# Patient Record
Sex: Male | Born: 1960 | Race: White | Hispanic: No | Smoking: Current every day smoker
Health system: Southern US, Community
[De-identification: ages and names within clinical notes are randomized; demographics above are authoritative.]

## PROBLEM LIST (undated history)

## (undated) DIAGNOSIS — T7840XA Allergy, unspecified, initial encounter: Secondary | ICD-10-CM

## (undated) DIAGNOSIS — I10 Essential (primary) hypertension: Secondary | ICD-10-CM

## (undated) DIAGNOSIS — E785 Hyperlipidemia, unspecified: Secondary | ICD-10-CM

## (undated) DIAGNOSIS — B882 Other arthropod infestations: Secondary | ICD-10-CM

## (undated) DIAGNOSIS — M1990 Unspecified osteoarthritis, unspecified site: Secondary | ICD-10-CM

## (undated) HISTORY — DX: Essential (primary) hypertension: I10

## (undated) HISTORY — DX: Other arthropod infestations: B88.2

## (undated) HISTORY — DX: Allergy, unspecified, initial encounter: T78.40XA

## (undated) HISTORY — DX: Hyperlipidemia, unspecified: E78.5

## (undated) HISTORY — PX: DENTAL SURGERY: SHX609

## (undated) HISTORY — DX: Unspecified osteoarthritis, unspecified site: M19.90

---

## 1984-07-10 HISTORY — PX: COLONOSCOPY: SHX174

## 1984-07-10 HISTORY — PX: ANAL FISSURE REPAIR: SHX2312

## 1984-07-10 HISTORY — PX: POLYPECTOMY: SHX149

## 1999-03-16 ENCOUNTER — Emergency Department (HOSPITAL_COMMUNITY): Admission: EM | Admit: 1999-03-16 | Discharge: 1999-03-16 | Payer: Self-pay | Admitting: Emergency Medicine

## 1999-03-16 ENCOUNTER — Encounter: Payer: Self-pay | Admitting: Emergency Medicine

## 2001-07-10 HISTORY — PX: UMBILICAL HERNIA REPAIR: SHX196

## 2001-09-18 ENCOUNTER — Ambulatory Visit (HOSPITAL_BASED_OUTPATIENT_CLINIC_OR_DEPARTMENT_OTHER): Admission: RE | Admit: 2001-09-18 | Discharge: 2001-09-18 | Payer: Self-pay | Admitting: *Deleted

## 2002-09-16 ENCOUNTER — Emergency Department (HOSPITAL_COMMUNITY): Admission: EM | Admit: 2002-09-16 | Discharge: 2002-09-16 | Payer: Self-pay | Admitting: Emergency Medicine

## 2005-08-10 HISTORY — PX: LEFT HEART CATH: SHX5946

## 2005-09-04 ENCOUNTER — Emergency Department (HOSPITAL_COMMUNITY): Admission: EM | Admit: 2005-09-04 | Discharge: 2005-09-04 | Payer: Self-pay | Admitting: Emergency Medicine

## 2011-07-11 HISTORY — PX: KNEE SURGERY: SHX244

## 2011-08-08 ENCOUNTER — Ambulatory Visit: Payer: Self-pay

## 2011-08-08 ENCOUNTER — Other Ambulatory Visit: Payer: Self-pay | Admitting: Occupational Medicine

## 2011-08-08 DIAGNOSIS — R52 Pain, unspecified: Secondary | ICD-10-CM

## 2014-07-10 DIAGNOSIS — B882 Other arthropod infestations: Secondary | ICD-10-CM

## 2014-07-10 HISTORY — DX: Other arthropod infestations: B88.2

## 2015-03-02 ENCOUNTER — Ambulatory Visit (INDEPENDENT_AMBULATORY_CARE_PROVIDER_SITE_OTHER): Payer: BLUE CROSS/BLUE SHIELD | Admitting: Internal Medicine

## 2015-03-02 ENCOUNTER — Ambulatory Visit (INDEPENDENT_AMBULATORY_CARE_PROVIDER_SITE_OTHER): Payer: BLUE CROSS/BLUE SHIELD

## 2015-03-02 VITALS — BP 128/78 | HR 89 | Temp 98.3°F | Resp 16 | Ht 71.5 in | Wt 177.0 lb

## 2015-03-02 DIAGNOSIS — F17200 Nicotine dependence, unspecified, uncomplicated: Secondary | ICD-10-CM

## 2015-03-02 DIAGNOSIS — M542 Cervicalgia: Secondary | ICD-10-CM | POA: Diagnosis not present

## 2015-03-02 DIAGNOSIS — R233 Spontaneous ecchymoses: Secondary | ICD-10-CM | POA: Diagnosis not present

## 2015-03-02 DIAGNOSIS — IMO0002 Reserved for concepts with insufficient information to code with codable children: Secondary | ICD-10-CM

## 2015-03-02 DIAGNOSIS — F1020 Alcohol dependence, uncomplicated: Secondary | ICD-10-CM

## 2015-03-02 DIAGNOSIS — R259 Unspecified abnormal involuntary movements: Secondary | ICD-10-CM

## 2015-03-02 DIAGNOSIS — G4489 Other headache syndrome: Secondary | ICD-10-CM | POA: Diagnosis not present

## 2015-03-02 DIAGNOSIS — W57XXXA Bitten or stung by nonvenomous insect and other nonvenomous arthropods, initial encounter: Secondary | ICD-10-CM | POA: Diagnosis not present

## 2015-03-02 DIAGNOSIS — R079 Chest pain, unspecified: Secondary | ICD-10-CM | POA: Diagnosis not present

## 2015-03-02 DIAGNOSIS — F1021 Alcohol dependence, in remission: Secondary | ICD-10-CM

## 2015-03-02 LAB — POCT CBC
Granulocyte percent: 53.6 %G (ref 37–80)
HCT, POC: 47.7 % (ref 43.5–53.7)
Hemoglobin: 15.6 g/dL (ref 14.1–18.1)
Lymph, poc: 3.9 — AB (ref 0.6–3.4)
MCH, POC: 31.5 pg — AB (ref 27–31.2)
MCHC: 32.7 g/dL (ref 31.8–35.4)
MCV: 96.2 fL (ref 80–97)
MID (cbc): 1 — AB (ref 0–0.9)
MPV: 7.5 fL (ref 0–99.8)
POC Granulocyte: 5.6 (ref 2–6.9)
POC LYMPH PERCENT: 37.1 %L (ref 10–50)
POC MID %: 9.3 %M (ref 0–12)
Platelet Count, POC: 321 10*3/uL (ref 142–424)
RBC: 4.95 M/uL (ref 4.69–6.13)
RDW, POC: 13.6 %
WBC: 10.4 10*3/uL — AB (ref 4.6–10.2)

## 2015-03-02 LAB — POCT URINALYSIS DIPSTICK
Bilirubin, UA: NEGATIVE
Glucose, UA: NEGATIVE
Ketones, UA: NEGATIVE
Leukocytes, UA: NEGATIVE
Nitrite, UA: NEGATIVE
Protein, UA: NEGATIVE
Spec Grav, UA: 1.015
Urobilinogen, UA: 0.2
pH, UA: 8.5

## 2015-03-02 LAB — COMPREHENSIVE METABOLIC PANEL
ALT: 13 U/L (ref 9–46)
AST: 14 U/L (ref 10–35)
Albumin: 4.6 g/dL (ref 3.6–5.1)
Alkaline Phosphatase: 69 U/L (ref 40–115)
BUN: 8 mg/dL (ref 7–25)
CO2: 25 mmol/L (ref 20–31)
Calcium: 10.2 mg/dL (ref 8.6–10.3)
Chloride: 103 mmol/L (ref 98–110)
Creat: 0.73 mg/dL (ref 0.70–1.33)
Glucose, Bld: 111 mg/dL — ABNORMAL HIGH (ref 65–99)
Potassium: 4.5 mmol/L (ref 3.5–5.3)
Sodium: 139 mmol/L (ref 135–146)
Total Bilirubin: 0.5 mg/dL (ref 0.2–1.2)
Total Protein: 7.4 g/dL (ref 6.1–8.1)

## 2015-03-02 LAB — POCT UA - MICROSCOPIC ONLY
Bacteria, U Microscopic: NEGATIVE
Casts, Ur, LPF, POC: NEGATIVE
Crystals, Ur, HPF, POC: NEGATIVE
Epithelial cells, urine per micros: NEGATIVE
Mucus, UA: NEGATIVE
RBC, urine, microscopic: NEGATIVE
WBC, Ur, HPF, POC: NEGATIVE
Yeast, UA: NEGATIVE

## 2015-03-02 LAB — TSH: TSH: 2.216 u[IU]/mL (ref 0.350–4.500)

## 2015-03-02 LAB — POCT SEDIMENTATION RATE: POCT SED RATE: 4 mm/hr (ref 0–22)

## 2015-03-02 LAB — POCT RAPID STREP A (OFFICE): RAPID STREP A SCREEN: NEGATIVE

## 2015-03-02 MED ORDER — DOXYCYCLINE HYCLATE 100 MG PO TABS: 100.0000 mg | ORAL_TABLET | Freq: Two times a day (BID) | ORAL | Status: DC

## 2015-03-02 MED ORDER — IBUPROFEN 600 MG PO TABS: 600.0000 mg | ORAL_TABLET | Freq: Three times a day (TID) | ORAL | Status: AC | PRN

## 2015-03-02 MED ORDER — METHOCARBAMOL 750 MG PO TABS: 750.0000 mg | ORAL_TABLET | Freq: Four times a day (QID) | ORAL | Status: DC

## 2015-03-02 NOTE — Patient Instructions (Addendum)
[place] Spotted Fever [place] Spotted Fever (RMSF) is the oldest known tick-borne disease of people in the [place]. This disease was named because it was first described among people in the [facility] area who had an illness characterized by a rash with red-purple-black spots. This disease is caused by a rickettsia (Rickettsia rickettsii), a bacteria carried by the tick. The [facility] wood tick and the American dog tick acquire and transmit the RMSF bacteria (pictures NOT actual size). When a larval, nymphal, or adult tick feeds on an infected rodent or larger animal, the tick can become infected. Infected adult ticks then feed on people who may then get RMSF. The tick transmits the disease to humans during a prolonged period of feeding that lasts many hours, days, or even a couple weeks. The bite is painless and frequently goes unnoticed. An infected male tick may also pass the rickettsial bacteria to her eggs that then may mature to be infected adult ticks. The rickettsia that causes RMSF can also get into a person's body through damaged skin. A tick bite is not necessary. People can get RMSF if they crush a tick and get its blood or body fluids on their skin through a small cut or sore.  DIAGNOSIS Diagnosis is made by laboratory tests.  TREATMENT Treatment is with antibiotics (medications that kill rickettsia and other bacteria). Immediate treatment usually prevents death. GEOGRAPHIC RANGE This disease was reported only in the [facility] until 1931. RMSF has more recently been described among individuals in all states except [place], [place], and [place]. The highest reported incidences of RMSF now occur among residents of [place], [place], [place], and the [place]. TIME OF YEAR  Most cases are diagnosed during late spring and summer when ticks are most active. However, especially in the warmer [place] states, a few cases occur during the winter. SYMPTOMS    Symptoms of RMSF begin from 2 to 14 days after a tick bite. The most common early symptoms are fever, muscle aches, and headache followed by nausea (feeling sick to your stomach) or vomiting.  The RMSF rash is typically delayed until 3 or more days after symptom onset, and eventually develops in 9 of 10 infected patients by the fifth day of illness. If the disease is not treated it can cause death. If you get a fever, headache, muscle aches, rash, nausea, or vomiting within 2 weeks of a possible tick bite or exposure, you should see your caregiver immediately. PREVENTION Ticks prefer to hide in shady, moist ground litter. They can often be found above the ground clinging to tall grass, brush, shrubs and low tree branches. They also inhabit lawns and gardens, especially at the edges of woodlands and around old stone walls. Within the areas where ticks generally live, no naturally vegetated area can be considered completely free of infected ticks. The best precaution against RMSF is to avoid contact with soil, leaf litter, and vegetation as much as possible in tick-infested areas. For those who enjoy gardening or walking in their yards, clear brush and mow tall grass around houses and at the edges of gardens. This may help reduce the tick population in the immediate area. Applications of chemical insecticides by a licensed professional in the spring (late May) and fall (September) will also control ticks, especially in heavily infested areas. Treatment will never get rid of all the ticks. Getting rid of small animal populations that host ticks will also decrease the tick population. When working in the garden, [employer]  shrubs, or handling soil and vegetation, wear light-colored protective clothing and gloves. Spot-check often to prevent ticks from reaching the skin. Ticks cannot jump or fly. They will not drop from an above-ground perch onto a passing animal. Once a tick gains access to human skin it climbs  upward until it reaches a more protected area. For example, the back of the knee, groin, navel, armpit, ears, or nape of the neck. It then begins the slow process of embedding itself in the skin. Campers, hikers, field workers, and others who spend time in wooded, brushy, or tall grassy areas can avoid exposure to ticks by using the following precautions:  Wear light-colored clothing with a tight weave to spot ticks more easily and prevent contact with the skin.  Wear long pants tucked into socks, long-sleeved shirts tucked into pants and enclosed shoes or boots along with insect repellent.  Spray clothes with insect repellent containing either DEET or Permethrin. Only DEET can be used on exposed skin. Follow the manufacturer's directions carefully.  Wear a hat and keep long hair pulled back.  Stay on cleared, well-worn trails whenever possible.  Spot-check yourself and others often for the presence of ticks on clothes. If you find one, there are likely to be others. Check thoroughly.  Remove clothes after leaving tick-infested areas. If possible, wash them to eliminate any unseen ticks. Check yourself, your children and any pets from head to toe for the presence of ticks.  Shower and shampoo. You can greatly reduce your chances of contracting RMSF if you remove attached ticks as soon as possible. Regular checks of the body, including all body sites covered by hair (head, armpits, genitals), allow removal of the tick before rickettsial transmission. To remove an attached tick, use a forceps or tweezers to detach the intact tick without leaving mouth parts in the skin. The tick bite wound should be cleansed after tick removal. Remember the most common symptoms of RMSF are fever, muscle aches, headache, and nausea or vomiting with a later onset of rash. If you get these symptoms after a tick bite and while living in an area where RMSF is found, RMSF should be suspected. If the disease is not  treated, it can cause death. See your caregiver immediately if you get these symptoms. Do this even if not aware of a tick bite. Document Released: 10/08/2000 Document Revised: 11/10/2013 Document Reviewed: 05/31/2009 [facility] Patient Information 2015 [place], [place]. This information is not intended to replace advice given to you by your health care provider. Make sure you discuss any questions you have with your health care provider. Tick Bite Information Ticks are insects that attach themselves to the skin and draw blood for food. There are various types of ticks. Common types include wood ticks and deer ticks. Most ticks live in shrubs and grassy areas. Ticks can climb onto your body when you make contact with leaves or grass where the tick is waiting. The most common places on the body for ticks to attach themselves are the scalp, neck, armpits, waist, and groin. Most tick bites are harmless, but sometimes ticks carry germs that cause diseases. These germs can be spread to a person during the tick's feeding process. The chance of a disease spreading through a tick bite depends on:   The type of tick.  Time of year.   How long the tick is attached.   Geographic location.  HOW CAN YOU PREVENT TICK BITES? Take these steps to help prevent tick bites when  you are outdoors:  Wear protective clothing. Long sleeves and long pants are best.   Wear white clothes so you can see ticks more easily.  Tuck your pant legs into your socks.   If walking on a trail, stay in the middle of the trail to avoid brushing against bushes.  Avoid walking through areas with long grass.  Put insect repellent on all exposed skin and along boot tops, pant legs, and sleeve cuffs.   Check clothing, hair, and skin repeatedly and before going inside.   Brush off any ticks that are not attached.  Take a shower or bath as soon as possible after being outdoors.  WHAT IS THE PROPER WAY TO REMOVE A  TICK? Ticks should be removed as soon as possible to help prevent diseases caused by tick bites.  If latex gloves are available, put them on before trying to remove a tick.   Using fine-point tweezers, grasp the tick as close to the skin as possible. You may also use curved forceps or a tick removal tool. Grasp the tick as close to its head as possible. Avoid grasping the tick on its body.  Pull gently with steady upward pressure until the tick lets go. Do not twist the tick or jerk it suddenly. This may break off the tick's head or mouth parts.  Do not squeeze or crush the tick's body. This could force disease-carrying fluids from the tick into your body.   After the tick is removed, wash the bite area and your hands with soap and water or other disinfectant such as alcohol.  Apply a small amount of antiseptic cream or ointment to the bite site.   Wash and disinfect any instruments that were used.  Do not try to remove a tick by applying a hot match, petroleum jelly, or fingernail polish to the tick. These methods do not work and may increase the chances of disease being spread from the tick bite.  WHEN SHOULD YOU SEEK MEDICAL CARE? Contact your health care provider if you are unable to remove a tick from your skin or if a part of the tick breaks off and is stuck in the skin.  After a tick bite, you need to be aware of signs and symptoms that could be related to diseases spread by ticks. Contact your health care provider if you develop any of the following in the days or weeks after the tick bite:  Unexplained fever.  Rash. A circular rash that appears days or weeks after the tick bite may indicate the possibility of Lyme disease. The rash may resemble a target with a bull's-eye and may occur at a different part of your body than the tick bite.  Redness and swelling in the area of the tick bite.   Tender, swollen lymph glands.   Diarrhea.   Weight loss.   Cough.    Fatigue.   Muscle, joint, or bone pain.   Abdominal pain.   Headache.   Lethargy or a change in your level of consciousness.  Difficulty walking or moving your legs.   Numbness in the legs.   Paralysis.  Shortness of breath.   Confusion.   Repeated vomiting.  Document Released: 06/23/2000 Document Revised: 04/16/2013 Document Reviewed: 12/04/2012 [facility] Patient Information 2015 [place], [place]. This information is not intended to replace advice given to you by your health care provider. Make sure you discuss any questions you have with your health care provider. Tick Bite Information Ticks are insects that  attach themselves to the skin and draw blood for food. There are various types of ticks. Common types include wood ticks and deer ticks. Most ticks live in shrubs and grassy areas. Ticks can climb onto your body when you make contact with leaves or grass where the tick is waiting. The most common places on the body for ticks to attach themselves are the scalp, neck, armpits, waist, and groin. Most tick bites are harmless, but sometimes ticks carry germs that cause diseases. These germs can be spread to a person during the tick's feeding process. The chance of a disease spreading through a tick bite depends on:   The type of tick.  Time of year.   How long the tick is attached.   Geographic location.  HOW CAN YOU PREVENT TICK BITES? Take these steps to help prevent tick bites when you are outdoors:  Wear protective clothing. Long sleeves and long pants are best.   Wear white clothes so you can see ticks more easily.  Tuck your pant legs into your socks.   If walking on a trail, stay in the middle of the trail to avoid brushing against bushes.  Avoid walking through areas with long grass.  Put insect repellent on all exposed skin and along boot tops, pant legs, and sleeve cuffs.   Check clothing, hair, and skin repeatedly and before going  inside.   Brush off any ticks that are not attached.  Take a shower or bath as soon as possible after being outdoors.  WHAT IS THE PROPER WAY TO REMOVE A TICK? Ticks should be removed as soon as possible to help prevent diseases caused by tick bites.  If latex gloves are available, put them on before trying to remove a tick.   Using fine-point tweezers, grasp the tick as close to the skin as possible. You may also use curved forceps or a tick removal tool. Grasp the tick as close to its head as possible. Avoid grasping the tick on its body.  Pull gently with steady upward pressure until the tick lets go. Do not twist the tick or jerk it suddenly. This may break off the tick's head or mouth parts.  Do not squeeze or crush the tick's body. This could force disease-carrying fluids from the tick into your body.   After the tick is removed, wash the bite area and your hands with soap and water or other disinfectant such as alcohol.  Apply a small amount of antiseptic cream or ointment to the bite site.   Wash and disinfect any instruments that were used.  Do not try to remove a tick by applying a hot match, petroleum jelly, or fingernail polish to the tick. These methods do not work and may increase the chances of disease being spread from the tick bite.  WHEN SHOULD YOU SEEK MEDICAL CARE? Contact your health care provider if you are unable to remove a tick from your skin or if a part of the tick breaks off and is stuck in the skin.  After a tick bite, you need to be aware of signs and symptoms that could be related to diseases spread by ticks. Contact your health care provider if you develop any of the following in the days or weeks after the tick bite:  Unexplained fever.  Rash. A circular rash that appears days or weeks after the tick bite may indicate the possibility of Lyme disease. The rash may resemble a target with a bull's-eye and may  occur at a different part of your body than  the tick bite.  Redness and swelling in the area of the tick bite.   Tender, swollen lymph glands.   Diarrhea.   Weight loss.   Cough.   Fatigue.   Muscle, joint, or bone pain.   Abdominal pain.   Headache.   Lethargy or a change in your level of consciousness.  Difficulty walking or moving your legs.   Numbness in the legs.   Paralysis.  Shortness of breath.   Confusion.   Repeated vomiting.  Document Released: 06/23/2000 Document Revised: 04/16/2013 Document Reviewed: 12/04/2012 [facility] Patient Information 2015 [place], [place]. This information is not intended to replace advice given to you by your health care provider. Make sure you discuss any questions you have with your health care provider. Headache and Arthritis Headaches and arthritis are common problems. This causes an interest in the possible role of arthritis in causing headaches. Several major forms of arthritis exist. Two of the most common types are:  Rheumatoid arthritis.  Osteoarthritis. Rheumatoid arthritis may begin at any age. It is a condition in which the body attacks some of its own tissues, thinking they do not belong. This leads to destruction of the bony areas around the joints. This condition may afflict any of the body's joints. It usually produces a deformity of the joint. The hands and fingers no longer appear straight but often appear angled towards one side. In some cases, the spine may be involved. Most often it is the vertebrae of the neck (cervical spine). The areas of the neck most commonly afflicted by rheumatoid arthritis are the first and second cervical vertebrae. Curiously, rheumatoid arthritis, though it often produces severe deformities, is not always painful.  The more common form of arthritis is osteoarthritis. It is a wear-and-tear form of arthritis. It usually does not produce deformity of the joints or destruction of the bony tissues. Rather the ligaments weaken.  They may be calcified due to the body's attempt to heal the damage. The larger joints of the body and those joints that take the most stress and strain are the most often affected. In the neck region this osteoarthritis usually involves the fifth, sixth and seventh vertebrae. This is because the effects of posture produce the most fatigue on them. Osteoarthritis is often more painful than rheumatoid arthritis.  During workups for arthritis, a test evaluating inflammation, (the sedimentation rate) often is performed. In rheumatoid arthritis, this test will usually be elevated. Other tests for inflammation may also be elevated. In patients with osteoarthritis, x-rays of the neck or jaw joints will show changes from "lipping" of the vertebrae. This is caused by calcium deposits in the ligaments. Or they may show narrowing of the space between the vertebrae, or spur formation (from calcium deposits). If severe, it may cause obstruction of the holes where the nerves pass from the spine to the body. In rheumatoid arthritis, dislocation of vertebrae may occur in the upper neck. CT scan and MRI in patients with osteoarthritis may show bulging of the discs that cushion the vertebrae. In the most severe cases, herniation of the discs may occur.  Headaches, felt as a pain in the neck, may be caused by arthritis if the first, second or third vertebrae are involved. This condition is due to the nerves that supply the scalp only originating from this area of the spine. Neck pain itself, whether alone or coupled with headaches, can involve any portion of the neck. If  the jaw is involved, the symptoms are similar to those of Temporomandibular Joint Syndrome (TMJ).  The progressive severity of rheumatoid arthritis may be slowed by a variety of potent medications. In osteoarthritis, its progression is not usually hindered by medication. The following may be helpful in slowing the advancement of the disorder:  Lifestyle  adjustment.  Exercise.  Rest.  Weight loss. Medications, such as the nonsteroidal anti-inflammatory agents (NSAIDs), are useful. They may reduce the pain and improve the reduced motion which occurs in joints afflicted by arthritis. From some studies, the use of acetaminophen appears to be as effective in controlling the pain of arthritis as the NSAIDs. Physical modalities may also be useful for arthritis. They include:  Heat.  Massage.  Exercise. But physical therapy must be prescribed by a caregiver, just as most medications for arthritis.  Document Released: 09/16/2003 Document Revised: 07/01/2013 Document Reviewed: 09/29/2013 [facility] Patient Information 2015 [place], [place]. This information is not intended to replace advice given to you by your health care provider. Make sure you discuss any questions you have with your health care provider. Doxycycline delayed-release capsules What is this medicine? DOXYCYCLINE (dox i SYE kleen) is a tetracycline antibiotic. It is used to treat certain kinds of bacterial infections, Lyme disease, and malaria. It will not work for colds, flu, or other viral infections. This medicine may be used for other purposes; ask your health care provider or pharmacist if you have questions. COMMON BRAND NAME(S): Doryx, Oracea What should I tell my health care provider before I take this medicine? They need to know if you have any of these conditions: -bowel disease like colitis -liver disease -long exposure to sunlight like working outdoors -an unusual or allergic reaction to doxycycline, tetracycline antibiotics, other medicines, foods, dyes, or preservatives -pregnant or trying to get pregnant -breast-feeding How should I use this medicine? Take this medicine by mouth with a full glass of water. Follow the directions on the prescription label. Do not crush or chew. The capsules may be opened and the pellets sprinkled on applesauce. Swallow the pellets  whole without chewing. Follow with an 8 ounce glass of water to help you swallow all the pellets. Do not prepare a dose and store for later use. The applesauce mixture should be taken immediately after you prepare it. It is best to take this medicine without other food, but if it upsets your stomach take it with food. Take your medicine at regular intervals. Do not take your medicine more often than directed. Take all of your medicine as directed even if you think your are better. Do not skip doses or stop your medicine early. Talk to your pediatrician regarding the use of this medicine in children. Special care may be needed. While this drug may be prescribed for children as young as 53 years old for selected conditions, precautions do apply. Overdosage: If you think you have taken too much of this medicine contact a poison control center or emergency room at once. NOTE: This medicine is only for you. Do not share this medicine with others. What if I miss a dose? If you miss a dose, take it as soon as you can. If it is almost time for your next dose, take only that dose. Do not take double or extra doses. What may interact with this medicine? -antacids -barbiturates -birth control pills -bismuth subsalicylate -carbamazepine -methoxyflurane -other antibiotics -phenytoin -vitamins that contain iron -warfarin This list may not describe all possible interactions. Give your health care provider  a list of all the medicines, herbs, non-prescription drugs, or dietary supplements you use. Also tell them if you smoke, drink alcohol, or use illegal drugs. Some items may interact with your medicine. What should I watch for while using this medicine? Tell your doctor or health care professional if your symptoms do not improve. Do not treat diarrhea with over the counter products. Contact your doctor if you have diarrhea that lasts more than 2 days or if it is severe and watery. Do not take this medicine just  before going to bed. It may not dissolve properly when you lay down and can cause pain in your throat. Drink plenty of fluids while taking this medicine to also help reduce irritation in your throat. This medicine can make you more sensitive to the sun. Keep out of the sun. If you cannot avoid being in the sun, wear protective clothing and use sunscreen. Do not use sun lamps or tanning beds/booths. If you are being treated for a sexually transmitted infection, avoid sexual contact until you have finished your treatment. Your sexual partner may also need treatment. Avoid antacids, aluminum, calcium, magnesium, and iron products for 4 hours before and 2 hours after taking a dose of this medicine. Birth control pills may not work properly while you are taking this medicine. Talk to your doctor about using an extra method of birth control. If you are using this medicine to prevent malaria, you should still protect yourself from contact with mosquitos. Stay in screened-in areas, use mosquito nets, keep your body covered, and use an insect repellent. What side effects may I notice from receiving this medicine? Side effects that you should report to your doctor or health care professional as soon as possible: -allergic reactions like skin rash, itching or hives, swelling of the face, lips, or tongue -difficulty breathing -fever -itching in the rectal or genital area -pain on swallowing -redness, blistering, peeling or loosening of the skin, including inside the mouth -severe stomach pain or cramps -unusual bleeding or bruising -unusually weak or tired -yellowing of the eyes or skin Side effects that usually do not require medical attention (report to your doctor or health care professional if they continue or are bothersome): -diarrhea -loss of appetite -nausea, vomiting This list may not describe all possible side effects. Call your doctor for medical advice about side effects. You may report side  effects to FDA at 1-800-FDA-1088. Where should I keep my medicine? Keep out of the reach of children. Store at room temperature, below 25 degrees C (77 degrees F). Protect from light. Keep container tightly closed. Throw away any unused medicine after the expiration date. Taking this medicine after the expiration date can make you seriously ill. NOTE: This sheet is a summary. It may not cover all possible information. If you have questions about this medicine, talk to your doctor, pharmacist, or health care provider.  2015, [employer]. (2007-10-24 14:16:19)
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
## 2015-03-03 LAB — HIV ANTIBODY (ROUTINE TESTING W REFLEX): HIV 1&2 Ab, 4th Generation: NONREACTIVE

## 2015-03-03 LAB — B. BURGDORFI ANTIBODIES: B BURGDORFERI AB IGG+ IGM: 0.31 {ISR}

## 2015-03-03 LAB — RPR

## 2015-03-03 LAB — PSA: PSA: 1.06 ng/mL (ref <=4.00)

## 2015-03-04 ENCOUNTER — Ambulatory Visit (INDEPENDENT_AMBULATORY_CARE_PROVIDER_SITE_OTHER): Payer: BLUE CROSS/BLUE SHIELD

## 2015-03-04 LAB — CULTURE, GROUP A STREP: ORGANISM ID, BACTERIA: NORMAL

## 2015-03-04 LAB — ROCKY MTN SPOTTED FVR AB, IGM-BLOOD: ROCKY MTN SPOTTED FEVER, IGM: 0.19 IV

## 2015-03-08 ENCOUNTER — Ambulatory Visit (INDEPENDENT_AMBULATORY_CARE_PROVIDER_SITE_OTHER): Payer: BLUE CROSS/BLUE SHIELD | Admitting: Physician Assistant

## 2015-03-08 ENCOUNTER — Other Ambulatory Visit: Payer: Self-pay | Admitting: Physician Assistant

## 2015-03-08 ENCOUNTER — Encounter: Payer: Self-pay | Admitting: Physician Assistant

## 2015-03-08 ENCOUNTER — Telehealth: Payer: Self-pay | Admitting: Physician Assistant

## 2015-03-08 ENCOUNTER — Ambulatory Visit (HOSPITAL_COMMUNITY)
Admission: RE | Admit: 2015-03-08 | Discharge: 2015-03-08 | Disposition: A | Discharge status: Home / Self Care | Payer: BLUE CROSS/BLUE SHIELD | Source: Ambulatory Visit | Attending: Emergency Medicine | Admitting: Emergency Medicine

## 2015-03-08 ENCOUNTER — Ambulatory Visit (HOSPITAL_COMMUNITY)
Admission: RE | Admit: 2015-03-08 | Discharge: 2015-03-08 | Disposition: A | Discharge status: Home / Self Care | Payer: BLUE CROSS/BLUE SHIELD | Source: Ambulatory Visit | Attending: Physician Assistant | Admitting: Physician Assistant

## 2015-03-08 VITALS — BP 108/78 | HR 98 | Temp 98.4°F | Resp 16 | Ht 70.5 in | Wt 177.4 lb

## 2015-03-08 DIAGNOSIS — R233 Spontaneous ecchymoses: Secondary | ICD-10-CM | POA: Diagnosis not present

## 2015-03-08 DIAGNOSIS — Z1211 Encounter for screening for malignant neoplasm of colon: Secondary | ICD-10-CM

## 2015-03-08 DIAGNOSIS — Z87891 Personal history of nicotine dependence: Secondary | ICD-10-CM | POA: Diagnosis not present

## 2015-03-08 DIAGNOSIS — R911 Solitary pulmonary nodule: Secondary | ICD-10-CM

## 2015-03-08 DIAGNOSIS — J439 Emphysema, unspecified: Secondary | ICD-10-CM | POA: Diagnosis not present

## 2015-03-08 DIAGNOSIS — R918 Other nonspecific abnormal finding of lung field: Secondary | ICD-10-CM | POA: Insufficient documentation

## 2015-03-08 DIAGNOSIS — D739 Disease of spleen, unspecified: Secondary | ICD-10-CM | POA: Insufficient documentation

## 2015-03-08 NOTE — Telephone Encounter (Signed)
Pt called to inform CT (chest) has now been completed/ please call at [phone] to advise if follow up is needed.   Thank you,  [phone]

## 2015-03-08 NOTE — Progress Notes (Signed)
03/08/2015 at [place] / DOB: 1961/01/31 / MRN: [id number]  The patient  does not have a problem list on file.  SUBJECTIVE  Lawrence Duarte is a 54 y.o. well appearing male presenting for the chief complaint of follow up for petechial rash and neck pain diagnosed by Dr. Elder Cyphers on 03/02/15.  He reports the neck pain is improving and has been taking Ibuprofen and flexeril with good relief of the pain.  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  He denies side effects of the medication but has noted no change in the rash. He has a 50 pack year history of smoking and is not ready to quit today, but is considering the patch at this time.       He  has no past medical history on file.    Medications reviewed and updated by myself where necessary, and exist elsewhere in the encounter.   Lawrence Duarte is allergic to sudafed. He  reports that he has been smoking.  He does not have any smokeless tobacco history on file. He reports that he does not drink alcohol or use illicit drugs. He  has no sexual activity history on file. The patient  has past surgical history that includes Left heart cath (Feb. 2007).  His family history includes Heart disease in his father and paternal grandfather; Hypertension in his brother.  Review of Systems  Constitutional: Negative for fever, chills, weight loss and diaphoresis.  Respiratory: Negative for shortness of breath.   Cardiovascular: Negative for chest pain.  Gastrointestinal: Negative for nausea and abdominal pain.  Genitourinary: Negative.   Skin: Positive for rash. Negative for itching.  Neurological: Negative for dizziness and headaches.    OBJECTIVE  His  height is 5' 10.5" (1.791 m) and weight is 177 lb 6.4 oz (80.468 kg). His oral temperature is 98.4 F (36.9 C). His blood pressure is 108/78 and his pulse is 98. His  respiration is 16 and oxygen saturation is 97%.  The patient's body mass index is 25.09 kg/(m^2).  Physical Exam  Vitals reviewed. Constitutional: He is oriented to person, place, and time. He appears well-developed. No distress.  Eyes: EOM are normal. Pupils are equal, round, and reactive to light. No scleral icterus.  Neck: Normal range of motion.  Cardiovascular: Normal rate and regular rhythm.   Respiratory: Effort normal and breath sounds normal.  GI: He exhibits no distension.  Musculoskeletal: Normal range of motion.  Neurological: He is alert and oriented to person, place, and time. No cranial nerve deficit.  Skin: Skin is warm and dry. Rash (Petichial rash bilaterally on the lower extremity. Negative for tenderness and excoriation. ) noted. He is not diaphoretic.  Psychiatric: He has a normal mood and affect.   EXAM: CHEST 2 VIEW  COMPARISON: 09/04/2005.  FINDINGS: The heart size and mediastinal contours are within normal limits. Questionable nodular density is noted anteriorly projected over the lungs on lateral view only. A follow-up PA and lateral chest x-ray is suggested. If this nodular density remains nonenhanced chest CT is suggested to further evaluate. Lungs are stable otherwise. No pleural effusion pneumothorax. Heart size normal. No acute bony abnormality.  IMPRESSION: Questionable nodular density projected over the anterior lungs on lateral view only. Repeat PA and lateral chest x-ray is suggested. If this nodular density persists follow-up nonenhanced chest  CT is suggested to further evaluate.   No results found for this or any previous visit (from the past 24 hour(s)).  ASSESSMENT & PLAN  Lawrence Duarte was seen today for follow-up, neck pain and rash.  Diagnoses and all orders for this visit:  Lung nodule: Given patient's history of smoking will go ahead and further evaluate mass.   -     CT CHEST NODULE FOLLOW UP LOW DOSE W/O; Future  History of  smoking 25-50 pack years -     CT CHEST NODULE FOLLOW UP LOW DOSE W/O; Future  Petechial rash: His work up has been negative thus far, including a sed rate of 6.  Will manage with problem one and get him caught up from a screening standpoint.  Will send to derm if further work up is negative.   -     CT CHEST NODULE FOLLOW UP LOW DOSE W/O; Future -      AMB gi colonoscopy is in for colon screening   The patient was advised to call or come back to clinic if he does not see an improvement in symptoms, or worsens with the above plan.   Philis Fendt, MHS, PA-C [facility] 03/08/2015 4:11 PM

## 2015-03-09 ENCOUNTER — Encounter: Payer: Self-pay | Admitting: Gastroenterology

## 2015-03-09 NOTE — Telephone Encounter (Signed)
I have called and spoken with patient.  Philis Fendt, MS, PA-C   2:11 PM, 03/09/2015

## 2015-03-09 NOTE — Telephone Encounter (Signed)
Lawrence Duarte please review.

## 2015-03-22 ENCOUNTER — Telehealth: Payer: Self-pay

## 2015-03-22 NOTE — Telephone Encounter (Signed)
Clark or Guest - Pt said you both had advised him to go to a dermatologist for what is on his feet.  He says it is not worse, but not any better.  He can not get an appointment until the end of October and can barely work.  Any suggestions or could you help get him in somewhere sooner?  [phone]

## 2015-03-23 NOTE — Telephone Encounter (Signed)
Advise that getting in with derm takes time.  As far as we can tell the rash is not dangerous and is okay to wait until October.  Philis Fendt, MS, PA-C   6:07 PM, 03/23/2015

## 2015-04-10 HISTORY — PX: SKIN TAG REMOVAL: SHX780

## 2015-05-17 ENCOUNTER — Ambulatory Visit (AMBULATORY_SURGERY_CENTER): Payer: Self-pay | Admitting: *Deleted

## 2015-05-17 VITALS — Ht 71.5 in | Wt 184.6 lb

## 2015-05-17 DIAGNOSIS — Z1211 Encounter for screening for malignant neoplasm of colon: Secondary | ICD-10-CM

## 2015-05-17 MED ORDER — NA SULFATE-K SULFATE-MG SULF 17.5-3.13-1.6 GM/177ML PO SOLN: 1.0000 | Freq: Once | ORAL | Status: DC

## 2015-05-17 NOTE — Progress Notes (Signed)
No egg or soy allergy No issues with past sedation No diet pills No home 02 use emmi video declined  

## 2015-05-21 ENCOUNTER — Encounter: Payer: Self-pay | Admitting: Gastroenterology

## 2015-05-31 ENCOUNTER — Ambulatory Visit (AMBULATORY_SURGERY_CENTER): Payer: BLUE CROSS/BLUE SHIELD | Admitting: Gastroenterology

## 2015-05-31 ENCOUNTER — Encounter: Payer: Self-pay | Admitting: Gastroenterology

## 2015-05-31 VITALS — BP 115/71 | HR 75 | Temp 96.3°F | Resp 16 | Ht 71.5 in | Wt 184.0 lb

## 2015-05-31 DIAGNOSIS — K621 Rectal polyp: Secondary | ICD-10-CM

## 2015-05-31 DIAGNOSIS — Z1211 Encounter for screening for malignant neoplasm of colon: Secondary | ICD-10-CM | POA: Diagnosis not present

## 2015-05-31 DIAGNOSIS — D122 Benign neoplasm of ascending colon: Secondary | ICD-10-CM | POA: Diagnosis not present

## 2015-05-31 DIAGNOSIS — D120 Benign neoplasm of cecum: Secondary | ICD-10-CM

## 2015-05-31 DIAGNOSIS — D125 Benign neoplasm of sigmoid colon: Secondary | ICD-10-CM

## 2015-05-31 DIAGNOSIS — D123 Benign neoplasm of transverse colon: Secondary | ICD-10-CM

## 2015-05-31 DIAGNOSIS — D128 Benign neoplasm of rectum: Secondary | ICD-10-CM

## 2015-05-31 MED ORDER — SODIUM CHLORIDE 0.9 % IV SOLN: 500.0000 mL | INTRAVENOUS | Status: DC

## 2015-05-31 NOTE — Patient Instructions (Addendum)
One of your biggest health concerns is your smoking.  This increases your risk for most cancers and serious cardiovascular diseases such as strokes, heart attacks.  You should try your best to stop.  If you need assistance, please contact your PCP or Smoking Cessation Class at [facility] [phone]) or [place] (1-800-QUIT-NOW).      YOU HAD AN ENDOSCOPIC PROCEDURE TODAY AT [place] [facility]:   Refer to the procedure report that was given to you for any specific questions about what was found during the examination.  If the procedure report does not answer your questions, please call your gastroenterologist to clarify.  If you requested that your care partner not be given the details of your procedure findings, then the procedure report has been included in a sealed envelope for you to review at your convenience later.  YOU SHOULD EXPECT: Some feelings of bloating in the abdomen. Passage of more gas than usual.  Walking can help get rid of the air that was put into your GI tract during the procedure and reduce the bloating. If you had a lower endoscopy (such as a colonoscopy or flexible sigmoidoscopy) you may notice spotting of blood in your stool or on the toilet paper. If you underwent a bowel prep for your procedure, you may not have a normal bowel movement for a few days.  Please Note:  You might notice some irritation and congestion in your nose or some drainage.  This is from the oxygen used during your procedure.  There is no need for concern and it should clear up in a day or so.  SYMPTOMS TO REPORT IMMEDIATELY:   Following lower endoscopy (colonoscopy or flexible sigmoidoscopy):  Excessive amounts of blood in the stool  Significant tenderness or worsening of abdominal pains  Swelling of the abdomen that is new, acute  Fever of 100F or higher    For urgent or emergent issues, a gastroenterologist can be reached at any hour by calling [phone]  [phone].   DIET: Your first meal following the procedure should be a small meal and then it is ok to progress to your normal diet. Heavy or fried foods are harder to digest and may make you feel nauseous or bloated.  Likewise, meals heavy in dairy and vegetables can increase bloating.  Drink plenty of fluids but you should avoid alcoholic beverages for 24 hours.  ACTIVITY:  You should plan to take it easy for the rest of today and you should NOT DRIVE or use heavy machinery until tomorrow (because of the sedation medicines used during the test).    FOLLOW UP: Our staff will call the number listed on your records the next business day following your procedure to check on you and address any questions or concerns that you may have regarding the information given to you following your procedure. If we do not reach you, we will leave a message.  However, if you are feeling well and you are not experiencing any problems, there is no need to return our call.  We will assume that you have returned to your regular daily activities without incident.  If any biopsies were taken you will be contacted by phone or by letter within the next 1-3 weeks.  Please call us at [phone] if you have not heard about the biopsies in 3 weeks.    SIGNATURES/CONFIDENTIALITY: You and/or your care partner have signed paperwork which will be entered into your electronic medical record.  These signatures  attest to the fact that that the information above on your After Visit Summary has been reviewed and is understood.  Full responsibility of the confidentiality of this discharge information lies with you and/or your care-partner.   Resume medications. Information given on polyps.

## 2015-05-31 NOTE — Progress Notes (Signed)
Report to PACU, RN, vss, BBS= Clear.  

## 2015-05-31 NOTE — Progress Notes (Signed)
Called to room to assist during endoscopic procedure.  Patient ID and intended procedure confirmed with present staff. Received instructions for my participation in the procedure from the performing physician.  

## 2015-05-31 NOTE — Op Note (Signed)
[place]  [address] [place], [address]   COLONOSCOPY PROCEDURE REPORT PATIENT: Lawrence Duarte, Lawrence Duarte  MR#: [id number] BIRTHDATE: [birth year] , 68  yrs. old GENDER: male ENDOSCOPIST: Milus Banister, MD REFERRED [id number] Guest, M.D. PROCEDURE DATE:  05/31/2015 PROCEDURE:   Colonoscopy, screening and Colonoscopy with snare polypectomy First Screening Colonoscopy - Avg.  risk and is 50 yrs.  old or older Yes.  Prior Negative Screening - Now for repeat screening. N/A  History of Adenoma - Now for follow-up colonoscopy & has been > or = to 3 yrs.  N/A  Polyps removed today? Yes ASA CLASS:   Class II INDICATIONS:Screening for colonic neoplasia and Colorectal Neoplasm Risk Assessment for this procedure is average risk. MEDICATIONS: Monitored anesthesia care and Propofol 400 mg IV DESCRIPTION OF PROCEDURE:   After the risks benefits and alternatives of the procedure were thoroughly explained, informed consent was obtained.  The digital rectal exam revealed no abnormalities of the rectum.   The LB [id number]  endoscope was introduced through the anus and advanced to the cecum, which was identified by both the appendix and ileocecal valve. No adverse events experienced.   The quality of the prep was good.  The instrument was then slowly withdrawn as the colon was fully examined. Estimated blood loss is zero unless otherwise noted in this procedure report.  COLON FINDINGS: A sessile polyp measuring 15 mm in size was found at the cecum.  A polypectomy was performed using snare cautery. The resection was complete, the polyp tissue was completely retrieved and sent to histology.   Six sessile polyps ranging between 3-105mm in size were found in the sigmoid colon, rectum, ascending colon, and transverse colon.  Polypectomies were performed with a cold snare.  The resection was complete, the polyp tissue was completely retrieved and sent to histology.   Two sessile  polyps ranging between 3-91mm in size were found in the rectum.  Polypectomies were performed with a cold snare.   The examination was otherwise normal.  Retroflexed views revealed no abnormalities. The time to cecum = 3.7 Withdrawal time = 23.1   The scope was withdrawn and the procedure completed. COMPLICATIONS: There were no immediate complications. ENDOSCOPIC IMPRESSION: 1.   Sessile polyp was found at the cecum; polypectomy was performed using snare cautery (jar 1) 2.  Six sessile polyps ranging between 3-32mm in size were found in the sigmoid colon, rectum, ascending colon, and transverse colon; polypectomies were performed with a cold snare (jar 2) 3.   Two sessile polyps ranging between 3-53mm in size were found in the rectum; polypectomies were performed with a cold snare (jar 3) 4.   The examination was otherwise normal  RECOMMENDATIONS: If the polyp(s) removed today are proven to be adenomatous (pre-cancerous) polyps, you will need a colonoscopy in 3 years. Otherwise you should continue to follow colorectal cancer screening guidelines for "routine risk" patients with a colonoscopy in 10 years.  You will receive a letter within 1-2 weeks with the results of your biopsy as well as final recommendations.  Please call my office if you have not received a letter after 3 weeks.  eSigned:  Milus Banister, MD 05/31/2015 9:27 AM

## 2015-06-01 ENCOUNTER — Telehealth: Payer: Self-pay | Admitting: *Deleted

## 2015-06-01 NOTE — Telephone Encounter (Signed)
No answer, message left for the patient. 

## 2015-06-06 ENCOUNTER — Encounter: Payer: Self-pay | Admitting: Gastroenterology

## 2015-10-14 ENCOUNTER — Other Ambulatory Visit: Payer: Self-pay | Admitting: Physician Assistant

## 2015-10-14 DIAGNOSIS — R918 Other nonspecific abnormal finding of lung field: Secondary | ICD-10-CM

## 2015-10-18 NOTE — Progress Notes (Signed)
Thank you Melanie!

## 2015-10-18 NOTE — Progress Notes (Signed)
I attempted to contact the patient, but had to leave a voicemail.  His insurance with [employer] termed on 10/03/15, so I need to find out if he has new coverage, and if so, obtain authorization prior to scheduling.  I will keep you posted on any updates.

## 2015-10-27 NOTE — Progress Notes (Signed)
We still have not heard from the patient.  Freda Munro attempted to reach him on Monday, but had to leave a voicemail.  He has not returned her call either.

## 2017-03-07 IMAGING — CR DG CHEST 2V
2 series · 2 of 2 positions shown · non-contrast
Comparison: 09/04/2005.

CLINICAL DATA: Tremor.  Headache.

EXAM:
CHEST  2 VIEW

[PA]
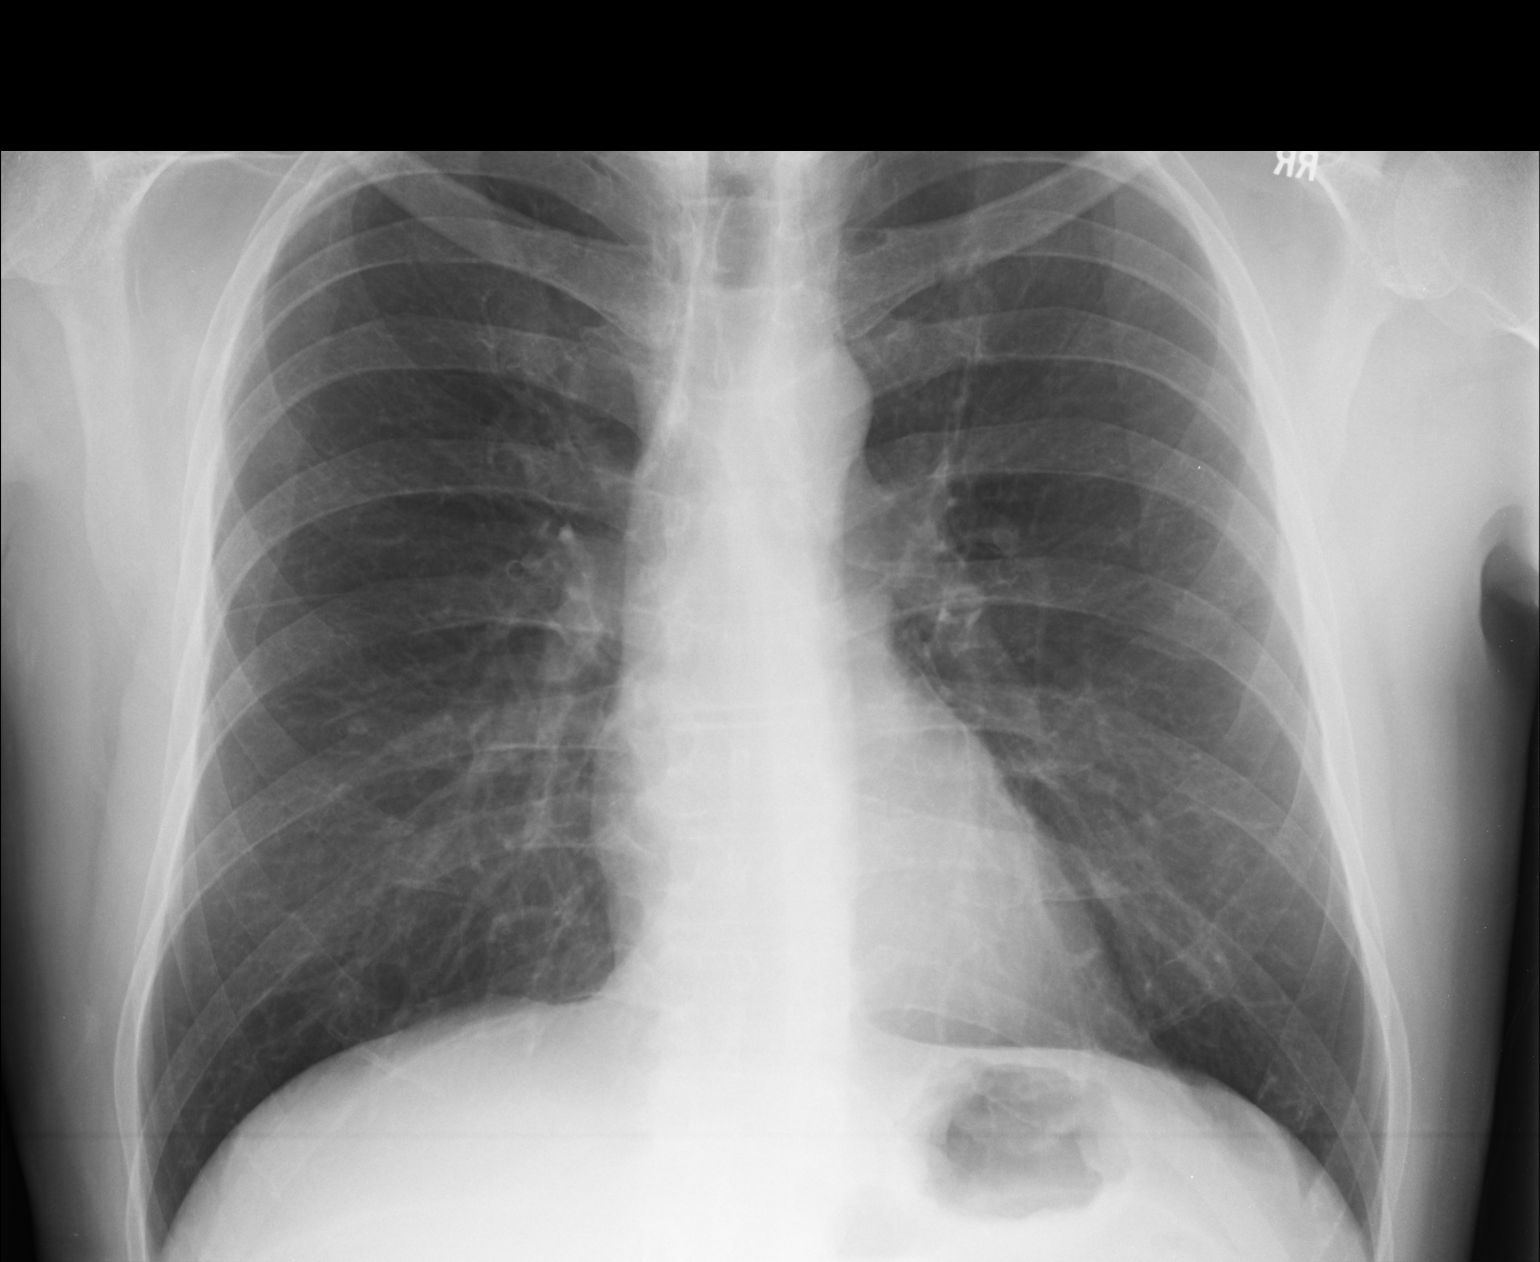

[lateral]
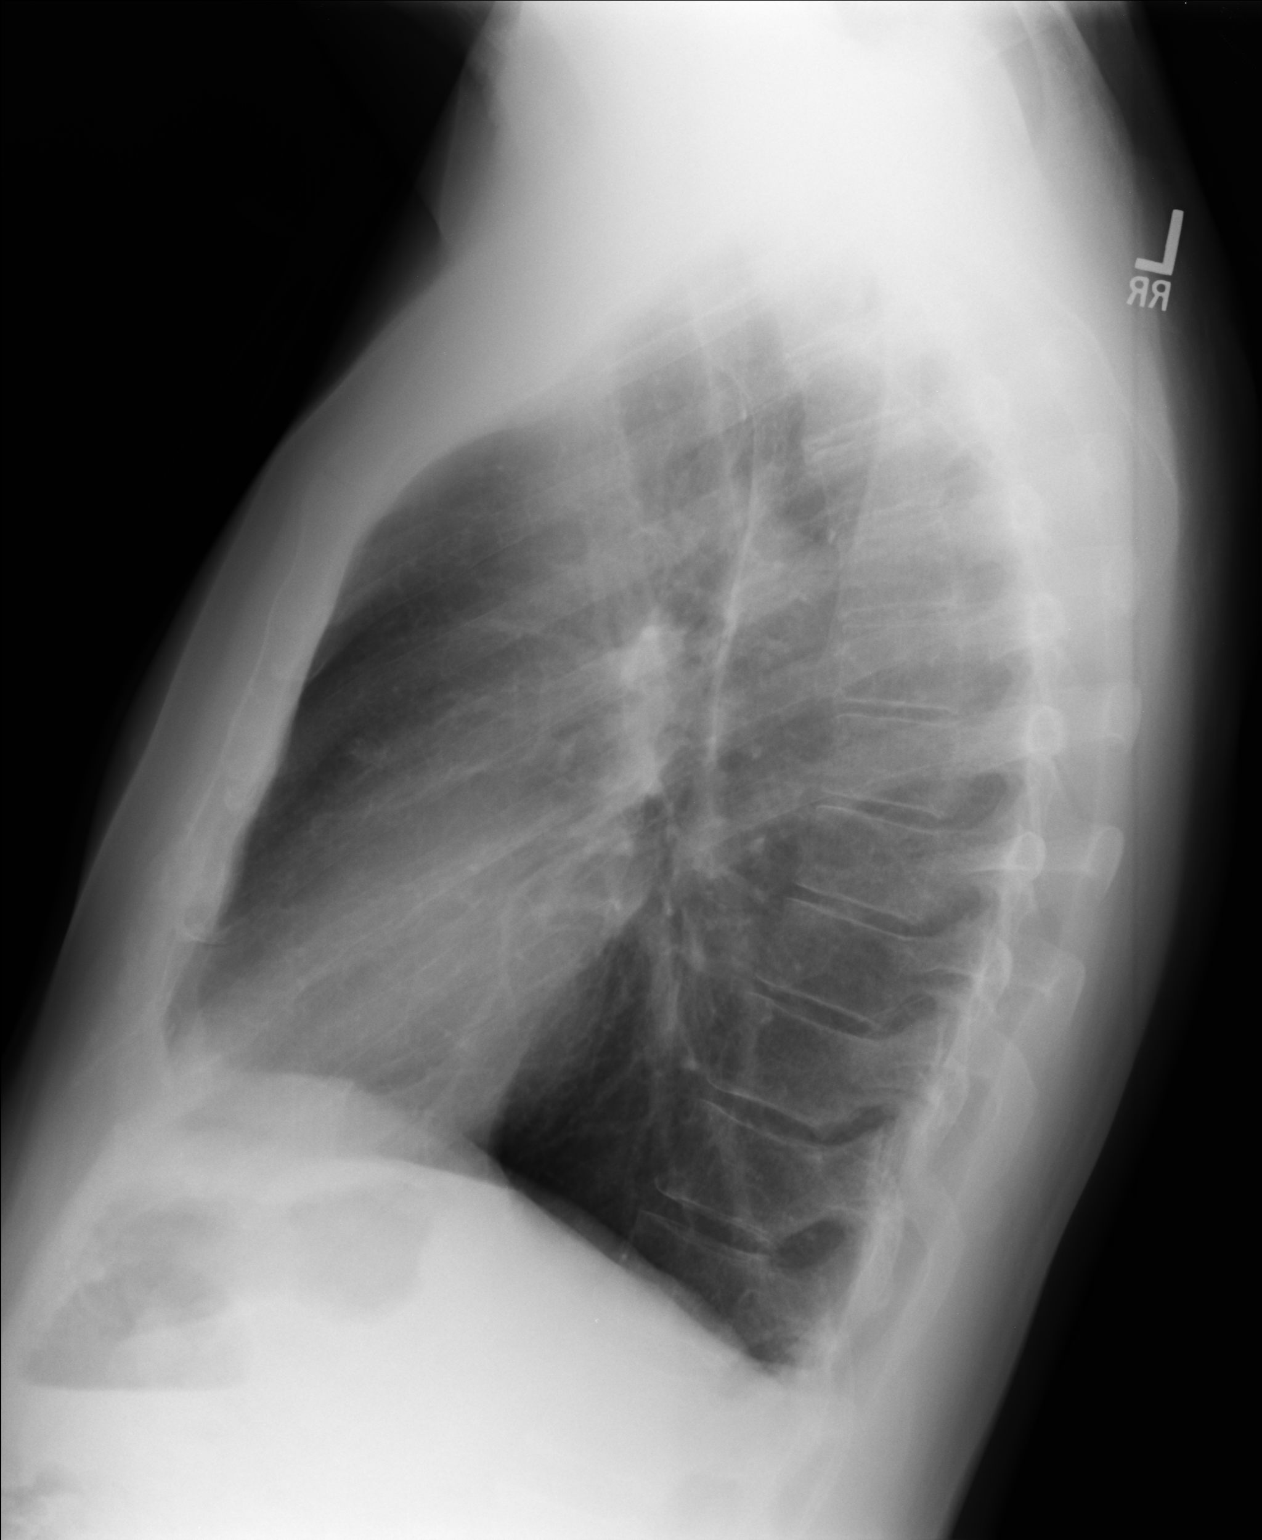

[2 of 2 positions shown; findings below may reference images not displayed]

FINDINGS: The heart size and mediastinal contours are within normal limits.
Questionable nodular density is noted anteriorly projected over the
lungs on lateral view only. A follow-up PA and lateral chest x-ray
is suggested. If this nodular density remains nonenhanced chest CT
is suggested to further evaluate. Lungs are stable otherwise. No
pleural effusion pneumothorax. Heart size normal. No acute bony
abnormality.
IMPRESSION: Questionable nodular density projected over the anterior lungs on
lateral view only. Repeat PA and lateral chest x-ray is suggested.
If this nodular density persists follow-up nonenhanced chest CT is
suggested to further evaluate.

## 2018-06-30 ENCOUNTER — Encounter: Payer: Self-pay | Admitting: Gastroenterology
